# Patient Record
Sex: Female | Born: 1955
Health system: Southern US, Community
[De-identification: ages and names within clinical notes are randomized; demographics above are authoritative.]

---

## 2001-07-28 ENCOUNTER — Encounter: Admission: RE | Admit: 2001-07-28 | Discharge: 2001-07-28 | Payer: Self-pay | Admitting: Family Medicine

## 2001-07-28 ENCOUNTER — Encounter: Payer: Self-pay | Admitting: Family Medicine

## 2003-11-28 ENCOUNTER — Emergency Department (HOSPITAL_COMMUNITY): Admission: EM | Admit: 2003-11-28 | Discharge: 2003-11-28 | Payer: Self-pay | Admitting: Emergency Medicine

## 2004-09-03 ENCOUNTER — Other Ambulatory Visit: Admission: RE | Admit: 2004-09-03 | Discharge: 2004-09-03 | Payer: Self-pay | Admitting: Family Medicine

## 2005-06-20 ENCOUNTER — Encounter: Admission: RE | Admit: 2005-06-20 | Discharge: 2005-06-20 | Payer: Self-pay | Admitting: Orthopedic Surgery

## 2005-06-23 ENCOUNTER — Encounter: Admission: RE | Admit: 2005-06-23 | Discharge: 2005-06-23 | Payer: Self-pay | Admitting: Orthopedic Surgery

## 2009-02-24 ENCOUNTER — Encounter: Admission: RE | Admit: 2009-02-24 | Discharge: 2009-02-24 | Payer: Self-pay | Admitting: Otolaryngology

## 2009-11-01 ENCOUNTER — Other Ambulatory Visit: Admission: RE | Admit: 2009-11-01 | Discharge: 2009-11-01 | Payer: Self-pay | Admitting: Family Medicine

## 2010-01-09 ENCOUNTER — Encounter: Admission: RE | Admit: 2010-01-09 | Discharge: 2010-01-09 | Payer: Self-pay | Admitting: Family Medicine

## 2010-01-13 ENCOUNTER — Ambulatory Visit: Payer: Self-pay | Admitting: Diagnostic Radiology

## 2010-01-13 ENCOUNTER — Emergency Department (HOSPITAL_BASED_OUTPATIENT_CLINIC_OR_DEPARTMENT_OTHER): Admission: EM | Admit: 2010-01-13 | Discharge: 2010-01-13 | Payer: Self-pay | Admitting: Emergency Medicine

## 2010-01-30 ENCOUNTER — Encounter
Admission: RE | Admit: 2010-01-30 | Discharge: 2010-01-30 | Payer: Self-pay | Source: Home / Self Care | Attending: Family Medicine | Admitting: Family Medicine

## 2010-05-01 LAB — COMPREHENSIVE METABOLIC PANEL
ALT: 19 U/L (ref 0–35)
AST: 20 U/L (ref 0–37)
CO2: 28 mEq/L (ref 19–32)
Calcium: 9 mg/dL (ref 8.4–10.5)
GFR calc Af Amer: >60 mL/min (ref >60)
Glucose, Bld: 134 mg/dL — ABNORMAL HIGH (ref 70–99)
Potassium: 3.5 mEq/L (ref 3.5–5.1)
Sodium: 145 mEq/L (ref 135–145)
Total Bilirubin: 0.5 mg/dL (ref 0.3–1.2)

## 2010-05-01 LAB — CBC
HCT: 35.4 % — ABNORMAL LOW (ref 36.0–46.0)
Hemoglobin: 11.8 g/dL — ABNORMAL LOW (ref 12.0–15.0)
MCHC: 33.4 g/dL (ref 30.0–36.0)
MCV: 92.9 fL (ref 78.0–100.0)

## 2010-05-01 LAB — URINALYSIS, ROUTINE W REFLEX MICROSCOPIC
Glucose, UA: NEGATIVE mg/dL
Ketones, ur: 15 mg/dL — AB
Protein, ur: NEGATIVE mg/dL
Specific Gravity, Urine: 1.027 (ref 1.005–1.030)
Urobilinogen, UA: 1 mg/dL (ref 0.0–1.0)

## 2010-05-01 LAB — SEDIMENTATION RATE: Sed Rate: 52 mm/hr — ABNORMAL HIGH (ref 0–22)

## 2010-05-01 LAB — DIFFERENTIAL
Basophils Absolute: 0 10*3/uL (ref 0.0–0.1)
Eosinophils Relative: 1 % (ref 0–5)
Lymphs Abs: 1.5 10*3/uL (ref 0.7–4.0)
Monocytes Absolute: 0.6 10*3/uL (ref 0.1–1.0)
Monocytes Relative: 10 % (ref 3–12)
Neutro Abs: 4 10*3/uL (ref 1.7–7.7)

## 2010-05-01 LAB — URINE MICROSCOPIC-ADD ON

## 2011-11-28 ENCOUNTER — Other Ambulatory Visit: Payer: Self-pay | Admitting: Family Medicine

## 2011-11-28 DIAGNOSIS — Z1231 Encounter for screening mammogram for malignant neoplasm of breast: Secondary | ICD-10-CM

## 2012-01-01 ENCOUNTER — Ambulatory Visit
Admission: RE | Admit: 2012-01-01 | Discharge: 2012-01-01 | Disposition: A | Discharge status: Home / Self Care | Payer: BC Managed Care – PPO | Source: Ambulatory Visit | Attending: Family Medicine | Admitting: Family Medicine

## 2012-01-01 DIAGNOSIS — Z1231 Encounter for screening mammogram for malignant neoplasm of breast: Secondary | ICD-10-CM

## 2012-03-23 IMAGING — CR DG LUMBAR SPINE COMPLETE 4+V
5 series · 5 of 5 positions shown · non-contrast
Comparison: None

CLINICAL DATA: Fall, low back pain.

LUMBAR SPINE - COMPLETE 4+ VIEW

[t l-spine a.p.]
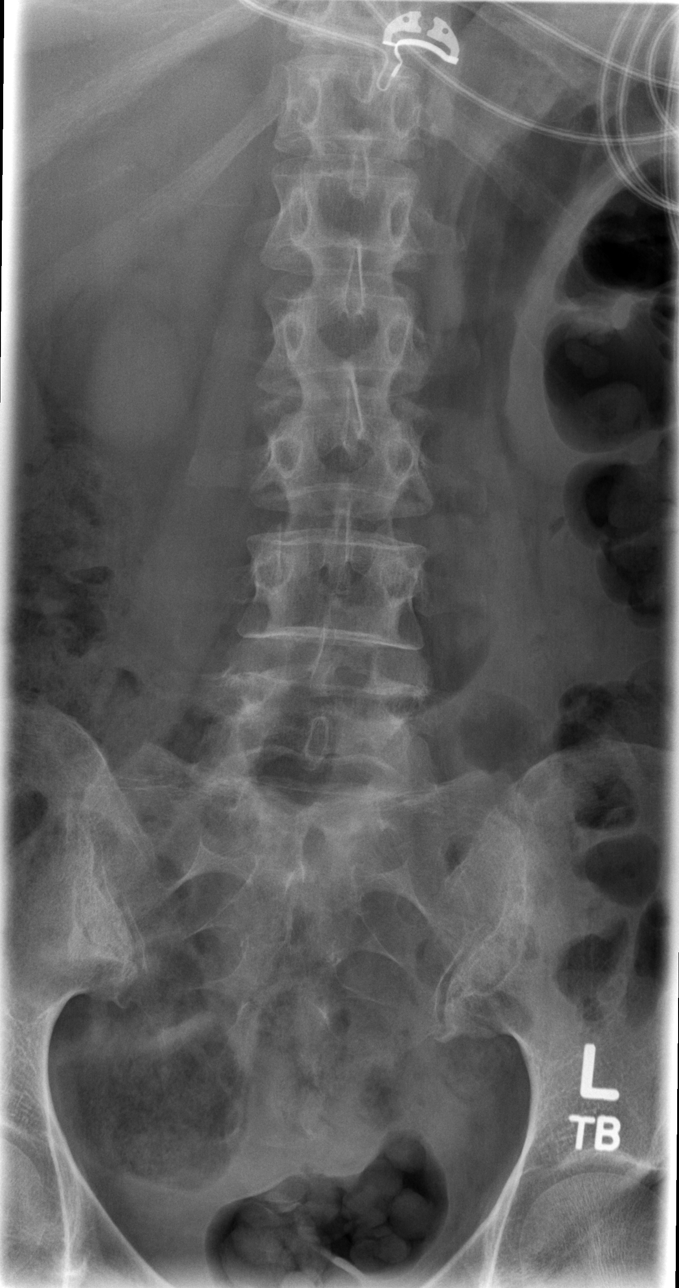

[t l-spine oblique exposure (1 of 2)]
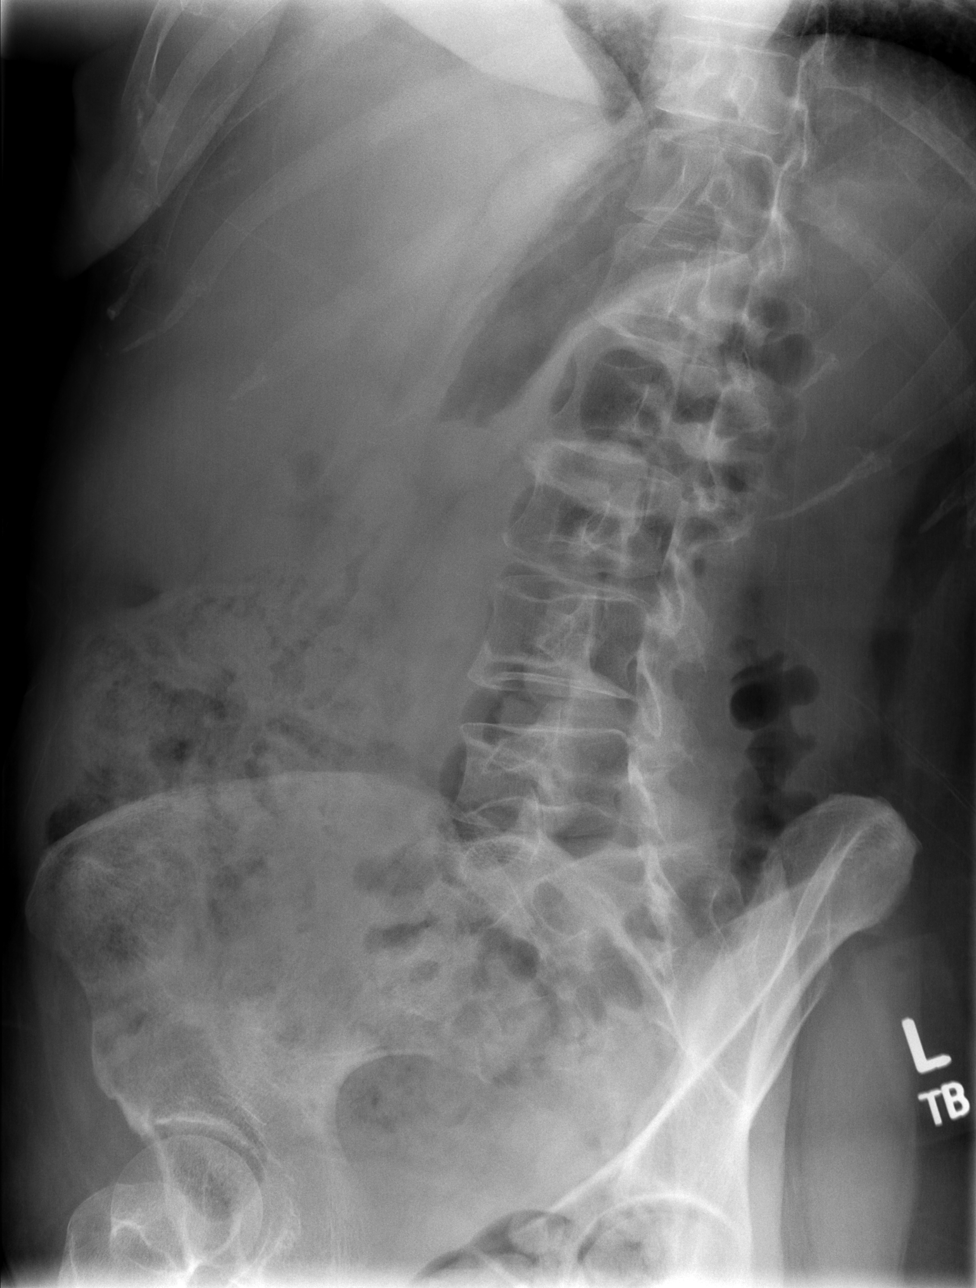

[t l-spine oblique exposure (2 of 2)]
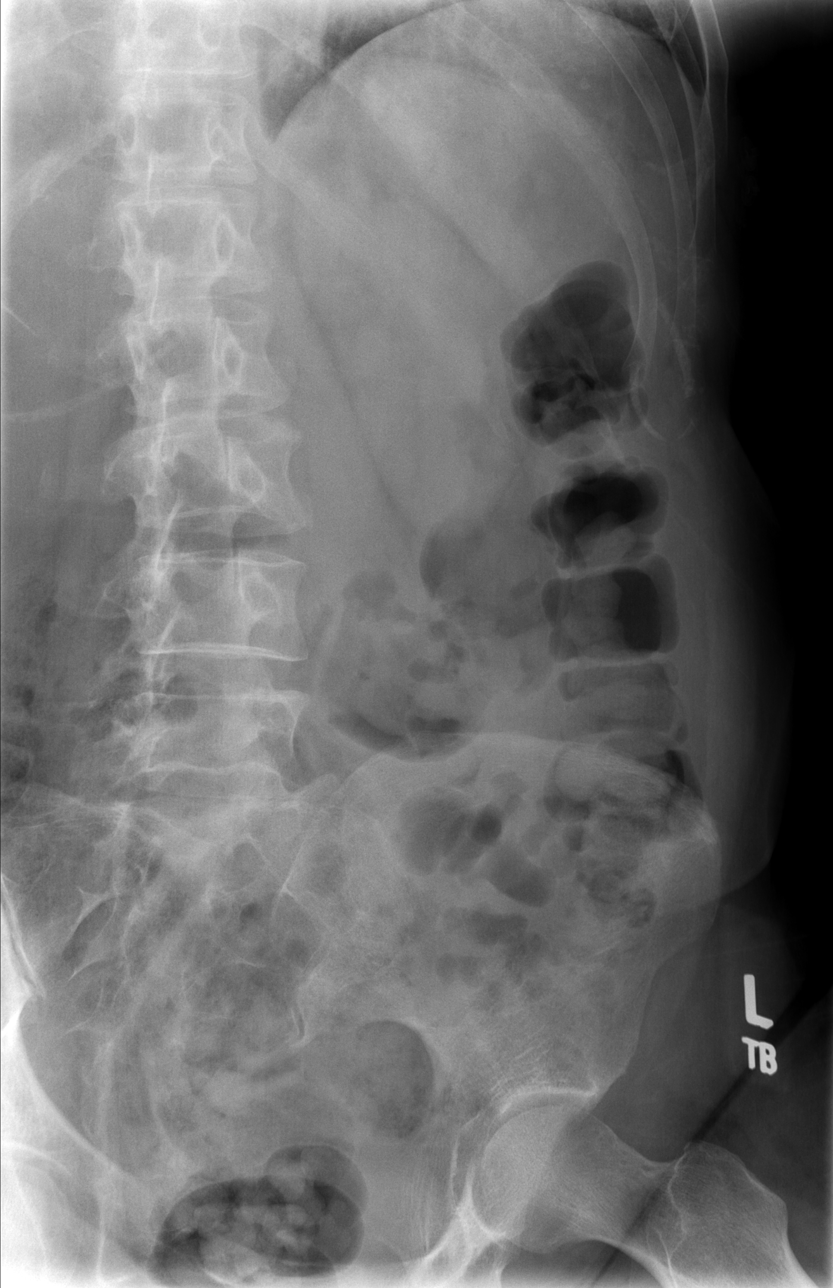

[t l-spine lat]
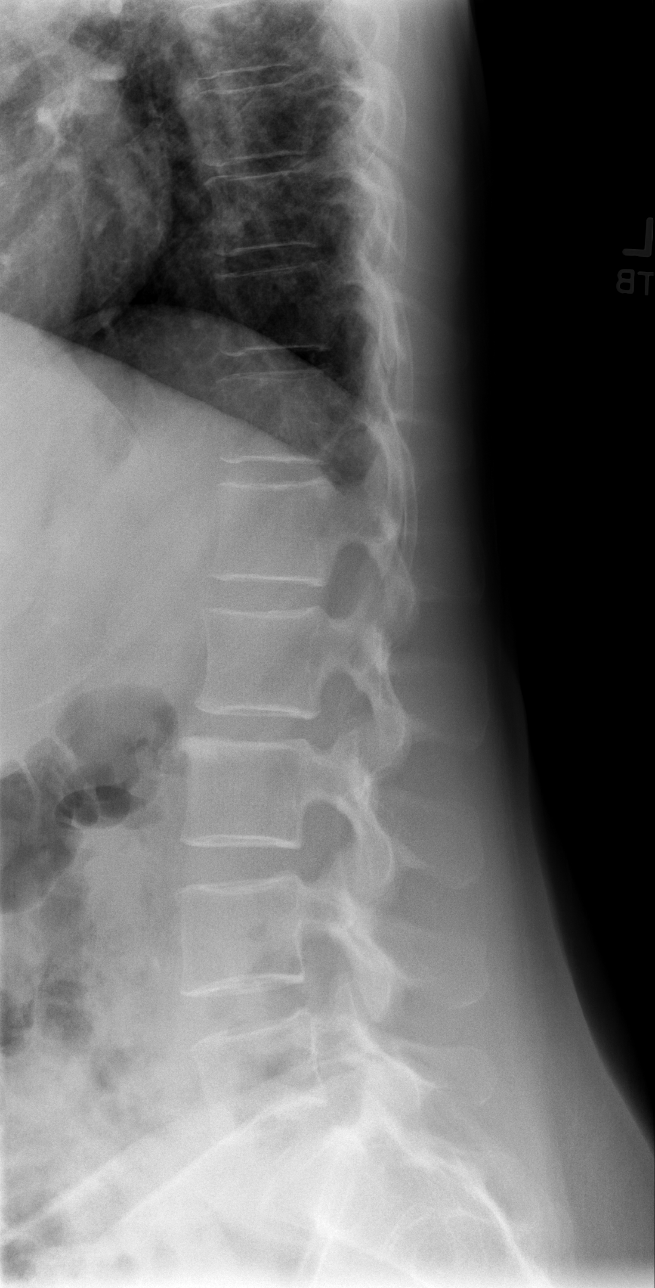

[t l-spine l5-s1 spot]
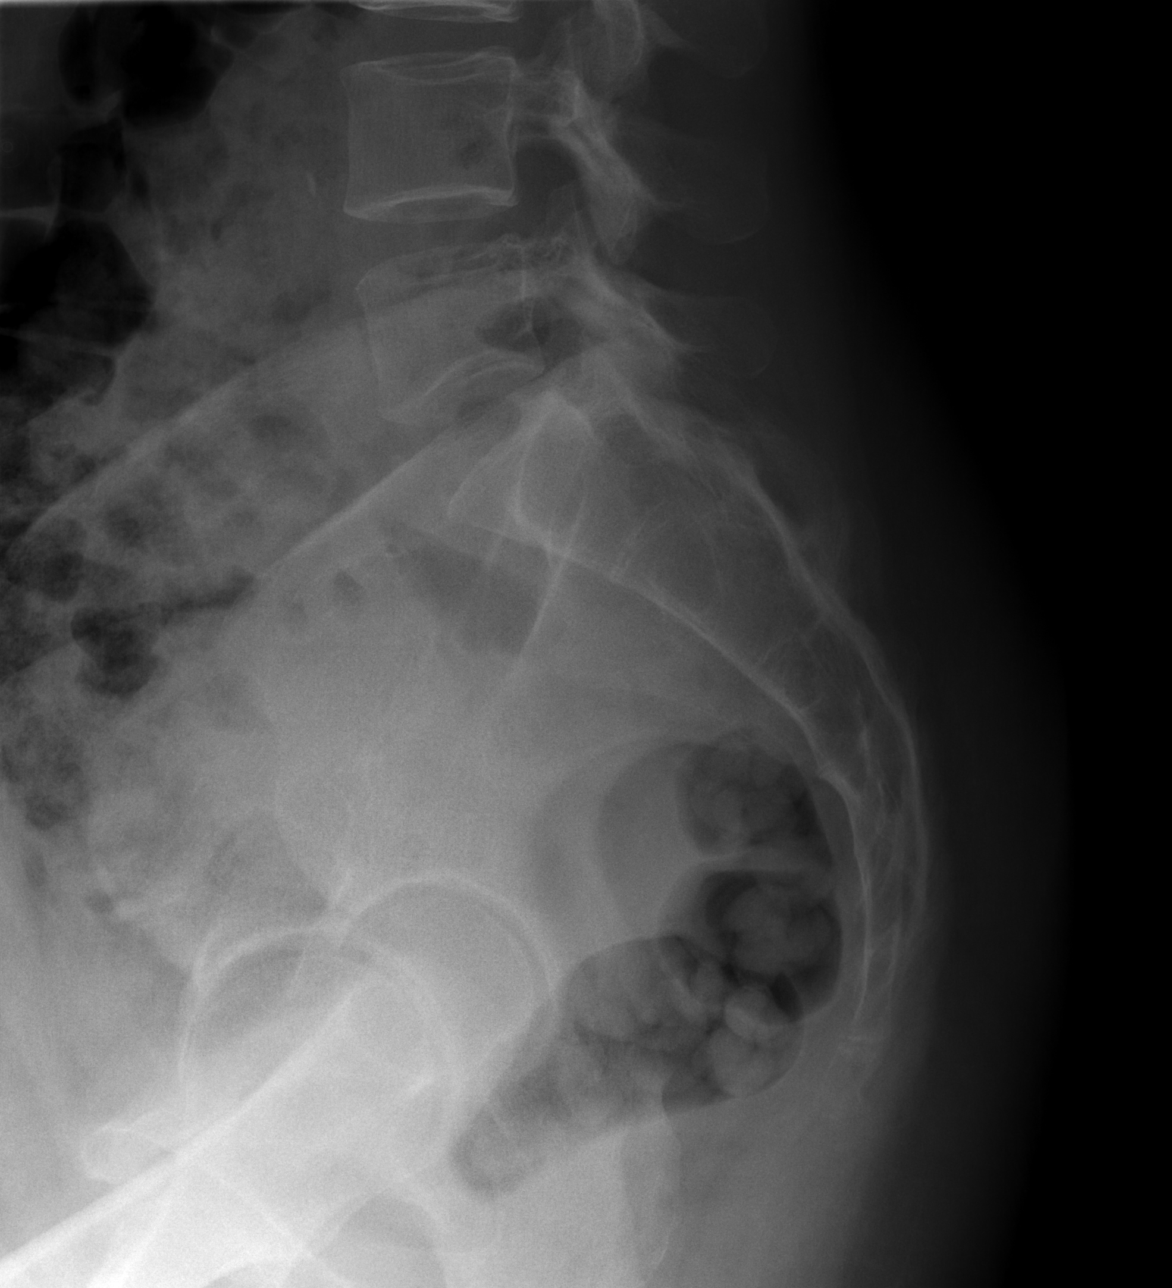

[5 of 5 positions shown; findings below may reference images not displayed]

FINDINGS: There are five lumbar-type vertebral bodies.  No fracture
or malalignment.  Disc spaces well maintained.  SI joints are
symmetric.
IMPRESSION: No acute findings.

## 2015-09-07 DIAGNOSIS — L300 Nummular dermatitis: Secondary | ICD-10-CM | POA: Diagnosis not present

## 2015-10-08 DIAGNOSIS — C44619 Basal cell carcinoma of skin of left upper limb, including shoulder: Secondary | ICD-10-CM | POA: Diagnosis not present

## 2015-10-11 DIAGNOSIS — Z0000 Encounter for general adult medical examination without abnormal findings: Secondary | ICD-10-CM | POA: Diagnosis not present

## 2015-10-11 DIAGNOSIS — M810 Age-related osteoporosis without current pathological fracture: Secondary | ICD-10-CM | POA: Diagnosis not present

## 2015-10-17 DIAGNOSIS — C44619 Basal cell carcinoma of skin of left upper limb, including shoulder: Secondary | ICD-10-CM | POA: Diagnosis not present

## 2015-12-08 DIAGNOSIS — L821 Other seborrheic keratosis: Secondary | ICD-10-CM | POA: Diagnosis not present

## 2015-12-08 DIAGNOSIS — C44619 Basal cell carcinoma of skin of left upper limb, including shoulder: Secondary | ICD-10-CM | POA: Diagnosis not present

## 2015-12-08 DIAGNOSIS — D485 Neoplasm of uncertain behavior of skin: Secondary | ICD-10-CM | POA: Diagnosis not present

## 2015-12-09 DIAGNOSIS — T63444A Toxic effect of venom of bees, undetermined, initial encounter: Secondary | ICD-10-CM | POA: Diagnosis not present

## 2016-01-05 DIAGNOSIS — C44619 Basal cell carcinoma of skin of left upper limb, including shoulder: Secondary | ICD-10-CM | POA: Diagnosis not present

## 2016-01-24 DIAGNOSIS — Z1231 Encounter for screening mammogram for malignant neoplasm of breast: Secondary | ICD-10-CM | POA: Diagnosis not present

## 2016-02-06 DIAGNOSIS — N6320 Unspecified lump in the left breast, unspecified quadrant: Secondary | ICD-10-CM | POA: Diagnosis not present

## 2016-02-07 DIAGNOSIS — H524 Presbyopia: Secondary | ICD-10-CM | POA: Diagnosis not present

## 2016-02-07 DIAGNOSIS — H5203 Hypermetropia, bilateral: Secondary | ICD-10-CM | POA: Diagnosis not present

## 2016-02-23 DIAGNOSIS — C44619 Basal cell carcinoma of skin of left upper limb, including shoulder: Secondary | ICD-10-CM | POA: Diagnosis not present

## 2016-10-18 DIAGNOSIS — Z0000 Encounter for general adult medical examination without abnormal findings: Secondary | ICD-10-CM | POA: Diagnosis not present

## 2016-10-18 DIAGNOSIS — Z13220 Encounter for screening for lipoid disorders: Secondary | ICD-10-CM | POA: Diagnosis not present

## 2016-10-18 DIAGNOSIS — M810 Age-related osteoporosis without current pathological fracture: Secondary | ICD-10-CM | POA: Diagnosis not present

## 2016-10-18 DIAGNOSIS — Z131 Encounter for screening for diabetes mellitus: Secondary | ICD-10-CM | POA: Diagnosis not present

## 2016-12-31 DIAGNOSIS — H5203 Hypermetropia, bilateral: Secondary | ICD-10-CM | POA: Diagnosis not present

## 2017-02-13 DIAGNOSIS — Z1231 Encounter for screening mammogram for malignant neoplasm of breast: Secondary | ICD-10-CM | POA: Diagnosis not present

## 2017-08-04 DIAGNOSIS — T63444A Toxic effect of venom of bees, undetermined, initial encounter: Secondary | ICD-10-CM | POA: Diagnosis not present

## 2017-09-10 DIAGNOSIS — Z131 Encounter for screening for diabetes mellitus: Secondary | ICD-10-CM | POA: Diagnosis not present

## 2017-09-10 DIAGNOSIS — Z0000 Encounter for general adult medical examination without abnormal findings: Secondary | ICD-10-CM | POA: Diagnosis not present

## 2017-09-10 DIAGNOSIS — M810 Age-related osteoporosis without current pathological fracture: Secondary | ICD-10-CM | POA: Diagnosis not present

## 2017-09-10 DIAGNOSIS — Z85828 Personal history of other malignant neoplasm of skin: Secondary | ICD-10-CM | POA: Diagnosis not present

## 2017-09-10 DIAGNOSIS — Z1211 Encounter for screening for malignant neoplasm of colon: Secondary | ICD-10-CM | POA: Diagnosis not present

## 2017-09-10 DIAGNOSIS — Z8249 Family history of ischemic heart disease and other diseases of the circulatory system: Secondary | ICD-10-CM | POA: Diagnosis not present

## 2017-09-10 DIAGNOSIS — Z13220 Encounter for screening for lipoid disorders: Secondary | ICD-10-CM | POA: Diagnosis not present

## 2017-10-15 DIAGNOSIS — R3915 Urgency of urination: Secondary | ICD-10-CM | POA: Diagnosis not present

## 2017-12-17 DIAGNOSIS — D225 Melanocytic nevi of trunk: Secondary | ICD-10-CM | POA: Diagnosis not present

## 2017-12-17 DIAGNOSIS — D1801 Hemangioma of skin and subcutaneous tissue: Secondary | ICD-10-CM | POA: Diagnosis not present

## 2017-12-17 DIAGNOSIS — Z85828 Personal history of other malignant neoplasm of skin: Secondary | ICD-10-CM | POA: Diagnosis not present

## 2018-01-06 DIAGNOSIS — H5203 Hypermetropia, bilateral: Secondary | ICD-10-CM | POA: Diagnosis not present

## 2018-02-17 DIAGNOSIS — Z1231 Encounter for screening mammogram for malignant neoplasm of breast: Secondary | ICD-10-CM | POA: Diagnosis not present

## 2018-03-17 DIAGNOSIS — R739 Hyperglycemia, unspecified: Secondary | ICD-10-CM | POA: Diagnosis not present

## 2018-03-17 DIAGNOSIS — R000 Tachycardia, unspecified: Secondary | ICD-10-CM | POA: Diagnosis not present

## 2018-03-27 ENCOUNTER — Telehealth: Payer: Self-pay

## 2018-03-27 NOTE — Telephone Encounter (Signed)
03-27-18/1:57pm. Left Pt vm asking for a return call [id number] to schedule event monitor./renee v.

## 2018-04-15 ENCOUNTER — Other Ambulatory Visit: Payer: Self-pay | Admitting: Physician Assistant

## 2018-04-15 ENCOUNTER — Ambulatory Visit (INDEPENDENT_AMBULATORY_CARE_PROVIDER_SITE_OTHER): Payer: BLUE CROSS/BLUE SHIELD

## 2018-04-15 DIAGNOSIS — R002 Palpitations: Secondary | ICD-10-CM

## 2018-04-15 DIAGNOSIS — R000 Tachycardia, unspecified: Secondary | ICD-10-CM | POA: Diagnosis not present

## 2018-09-24 DIAGNOSIS — T7840XA Allergy, unspecified, initial encounter: Secondary | ICD-10-CM | POA: Diagnosis not present

## 2018-12-23 DIAGNOSIS — M810 Age-related osteoporosis without current pathological fracture: Secondary | ICD-10-CM | POA: Diagnosis not present

## 2018-12-23 DIAGNOSIS — D649 Anemia, unspecified: Secondary | ICD-10-CM | POA: Diagnosis not present

## 2018-12-23 DIAGNOSIS — Z0000 Encounter for general adult medical examination without abnormal findings: Secondary | ICD-10-CM | POA: Diagnosis not present

## 2018-12-29 DIAGNOSIS — H524 Presbyopia: Secondary | ICD-10-CM | POA: Diagnosis not present

## 2018-12-29 DIAGNOSIS — H5203 Hypermetropia, bilateral: Secondary | ICD-10-CM | POA: Diagnosis not present

## 2018-12-29 DIAGNOSIS — H2513 Age-related nuclear cataract, bilateral: Secondary | ICD-10-CM | POA: Diagnosis not present

## 2018-12-29 DIAGNOSIS — H35361 Drusen (degenerative) of macula, right eye: Secondary | ICD-10-CM | POA: Diagnosis not present

## 2019-05-12 DIAGNOSIS — D485 Neoplasm of uncertain behavior of skin: Secondary | ICD-10-CM | POA: Diagnosis not present

## 2019-05-12 DIAGNOSIS — D225 Melanocytic nevi of trunk: Secondary | ICD-10-CM | POA: Diagnosis not present

## 2019-05-12 DIAGNOSIS — D1801 Hemangioma of skin and subcutaneous tissue: Secondary | ICD-10-CM | POA: Diagnosis not present

## 2019-05-12 DIAGNOSIS — L820 Inflamed seborrheic keratosis: Secondary | ICD-10-CM | POA: Diagnosis not present

## 2019-05-12 DIAGNOSIS — L570 Actinic keratosis: Secondary | ICD-10-CM | POA: Diagnosis not present

## 2019-05-12 DIAGNOSIS — L905 Scar conditions and fibrosis of skin: Secondary | ICD-10-CM | POA: Diagnosis not present

## 2019-05-12 DIAGNOSIS — C44519 Basal cell carcinoma of skin of other part of trunk: Secondary | ICD-10-CM | POA: Diagnosis not present

## 2019-05-12 DIAGNOSIS — L821 Other seborrheic keratosis: Secondary | ICD-10-CM | POA: Diagnosis not present

## 2019-05-24 DIAGNOSIS — T161XXA Foreign body in right ear, initial encounter: Secondary | ICD-10-CM | POA: Diagnosis not present

## 2019-06-04 DIAGNOSIS — Z1231 Encounter for screening mammogram for malignant neoplasm of breast: Secondary | ICD-10-CM | POA: Diagnosis not present

## 2019-06-08 DIAGNOSIS — C44519 Basal cell carcinoma of skin of other part of trunk: Secondary | ICD-10-CM | POA: Diagnosis not present

## 2019-07-21 DIAGNOSIS — Z131 Encounter for screening for diabetes mellitus: Secondary | ICD-10-CM | POA: Diagnosis not present

## 2019-07-21 DIAGNOSIS — Z0000 Encounter for general adult medical examination without abnormal findings: Secondary | ICD-10-CM | POA: Diagnosis not present

## 2019-07-21 DIAGNOSIS — Z13220 Encounter for screening for lipoid disorders: Secondary | ICD-10-CM | POA: Diagnosis not present

## 2019-07-21 DIAGNOSIS — Z23 Encounter for immunization: Secondary | ICD-10-CM | POA: Diagnosis not present

## 2019-10-04 DIAGNOSIS — M67431 Ganglion, right wrist: Secondary | ICD-10-CM | POA: Diagnosis not present

## 2019-10-04 DIAGNOSIS — M65331 Trigger finger, right middle finger: Secondary | ICD-10-CM | POA: Diagnosis not present

## 2019-10-24 DIAGNOSIS — T7840XA Allergy, unspecified, initial encounter: Secondary | ICD-10-CM | POA: Diagnosis not present

## 2019-12-02 DIAGNOSIS — M79644 Pain in right finger(s): Secondary | ICD-10-CM | POA: Diagnosis not present

## 2019-12-02 DIAGNOSIS — M65331 Trigger finger, right middle finger: Secondary | ICD-10-CM | POA: Diagnosis not present

## 2020-01-04 DIAGNOSIS — H2513 Age-related nuclear cataract, bilateral: Secondary | ICD-10-CM | POA: Diagnosis not present

## 2020-01-04 DIAGNOSIS — H353111 Nonexudative age-related macular degeneration, right eye, early dry stage: Secondary | ICD-10-CM | POA: Diagnosis not present

## 2020-03-08 DIAGNOSIS — H9190 Unspecified hearing loss, unspecified ear: Secondary | ICD-10-CM | POA: Diagnosis not present

## 2020-03-08 DIAGNOSIS — T162XXA Foreign body in left ear, initial encounter: Secondary | ICD-10-CM | POA: Diagnosis not present

## 2020-05-12 DIAGNOSIS — H6501 Acute serous otitis media, right ear: Secondary | ICD-10-CM | POA: Diagnosis not present

## 2020-05-12 DIAGNOSIS — H6983 Other specified disorders of Eustachian tube, bilateral: Secondary | ICD-10-CM | POA: Diagnosis not present

## 2020-06-01 DIAGNOSIS — H6983 Other specified disorders of Eustachian tube, bilateral: Secondary | ICD-10-CM | POA: Diagnosis not present

## 2020-06-01 DIAGNOSIS — H6501 Acute serous otitis media, right ear: Secondary | ICD-10-CM | POA: Diagnosis not present

## 2020-06-08 DIAGNOSIS — M65331 Trigger finger, right middle finger: Secondary | ICD-10-CM | POA: Diagnosis not present

## 2020-06-09 DIAGNOSIS — Z1231 Encounter for screening mammogram for malignant neoplasm of breast: Secondary | ICD-10-CM | POA: Diagnosis not present

## 2021-03-08 DIAGNOSIS — Z1211 Encounter for screening for malignant neoplasm of colon: Secondary | ICD-10-CM | POA: Diagnosis not present

## 2021-03-08 DIAGNOSIS — Z0000 Encounter for general adult medical examination without abnormal findings: Secondary | ICD-10-CM | POA: Diagnosis not present

## 2021-03-08 DIAGNOSIS — E785 Hyperlipidemia, unspecified: Secondary | ICD-10-CM | POA: Diagnosis not present

## 2021-03-08 DIAGNOSIS — M810 Age-related osteoporosis without current pathological fracture: Secondary | ICD-10-CM | POA: Diagnosis not present

## 2021-03-08 DIAGNOSIS — M1990 Unspecified osteoarthritis, unspecified site: Secondary | ICD-10-CM | POA: Diagnosis not present

## 2021-06-15 DIAGNOSIS — M85852 Other specified disorders of bone density and structure, left thigh: Secondary | ICD-10-CM | POA: Diagnosis not present

## 2021-06-15 DIAGNOSIS — M810 Age-related osteoporosis without current pathological fracture: Secondary | ICD-10-CM | POA: Diagnosis not present

## 2021-06-15 DIAGNOSIS — Z1231 Encounter for screening mammogram for malignant neoplasm of breast: Secondary | ICD-10-CM | POA: Diagnosis not present

## 2021-06-15 DIAGNOSIS — Z780 Asymptomatic menopausal state: Secondary | ICD-10-CM | POA: Diagnosis not present

## 2021-06-15 DIAGNOSIS — M85851 Other specified disorders of bone density and structure, right thigh: Secondary | ICD-10-CM | POA: Diagnosis not present

## 2021-06-20 DIAGNOSIS — H5203 Hypermetropia, bilateral: Secondary | ICD-10-CM | POA: Diagnosis not present

## 2021-06-20 DIAGNOSIS — H2513 Age-related nuclear cataract, bilateral: Secondary | ICD-10-CM | POA: Diagnosis not present

## 2021-06-20 DIAGNOSIS — H353111 Nonexudative age-related macular degeneration, right eye, early dry stage: Secondary | ICD-10-CM | POA: Diagnosis not present

## 2021-07-09 DIAGNOSIS — T162XXA Foreign body in left ear, initial encounter: Secondary | ICD-10-CM | POA: Diagnosis not present

## 2021-07-09 DIAGNOSIS — H9190 Unspecified hearing loss, unspecified ear: Secondary | ICD-10-CM | POA: Diagnosis not present

## 2021-07-09 DIAGNOSIS — I10 Essential (primary) hypertension: Secondary | ICD-10-CM | POA: Diagnosis not present

## 2021-07-23 DIAGNOSIS — I10 Essential (primary) hypertension: Secondary | ICD-10-CM | POA: Diagnosis not present

## 2021-07-23 DIAGNOSIS — L255 Unspecified contact dermatitis due to plants, except food: Secondary | ICD-10-CM | POA: Diagnosis not present

## 2021-08-13 DIAGNOSIS — I10 Essential (primary) hypertension: Secondary | ICD-10-CM | POA: Diagnosis not present

## 2021-08-13 DIAGNOSIS — T7840XA Allergy, unspecified, initial encounter: Secondary | ICD-10-CM | POA: Diagnosis not present

## 2021-10-10 DIAGNOSIS — Z1211 Encounter for screening for malignant neoplasm of colon: Secondary | ICD-10-CM | POA: Diagnosis not present

## 2021-10-10 DIAGNOSIS — K644 Residual hemorrhoidal skin tags: Secondary | ICD-10-CM | POA: Diagnosis not present

## 2021-10-10 DIAGNOSIS — D123 Benign neoplasm of transverse colon: Secondary | ICD-10-CM | POA: Diagnosis not present

## 2021-10-10 DIAGNOSIS — K648 Other hemorrhoids: Secondary | ICD-10-CM | POA: Diagnosis not present

## 2021-10-10 DIAGNOSIS — D175 Benign lipomatous neoplasm of intra-abdominal organs: Secondary | ICD-10-CM | POA: Diagnosis not present

## 2021-10-10 DIAGNOSIS — K5730 Diverticulosis of large intestine without perforation or abscess without bleeding: Secondary | ICD-10-CM | POA: Diagnosis not present

## 2021-10-15 DIAGNOSIS — D123 Benign neoplasm of transverse colon: Secondary | ICD-10-CM | POA: Diagnosis not present

## 2022-01-04 DIAGNOSIS — I10 Essential (primary) hypertension: Secondary | ICD-10-CM | POA: Diagnosis not present

## 2022-03-13 DIAGNOSIS — I10 Essential (primary) hypertension: Secondary | ICD-10-CM | POA: Diagnosis not present

## 2022-03-13 DIAGNOSIS — Z0000 Encounter for general adult medical examination without abnormal findings: Secondary | ICD-10-CM | POA: Diagnosis not present

## 2022-03-13 DIAGNOSIS — M810 Age-related osteoporosis without current pathological fracture: Secondary | ICD-10-CM | POA: Diagnosis not present

## 2022-03-28 DIAGNOSIS — L814 Other melanin hyperpigmentation: Secondary | ICD-10-CM | POA: Diagnosis not present

## 2022-03-28 DIAGNOSIS — D225 Melanocytic nevi of trunk: Secondary | ICD-10-CM | POA: Diagnosis not present

## 2022-03-28 DIAGNOSIS — L821 Other seborrheic keratosis: Secondary | ICD-10-CM | POA: Diagnosis not present

## 2022-05-02 DIAGNOSIS — M1711 Unilateral primary osteoarthritis, right knee: Secondary | ICD-10-CM | POA: Diagnosis not present

## 2022-05-02 DIAGNOSIS — M170 Bilateral primary osteoarthritis of knee: Secondary | ICD-10-CM | POA: Diagnosis not present

## 2022-05-02 DIAGNOSIS — M25572 Pain in left ankle and joints of left foot: Secondary | ICD-10-CM | POA: Diagnosis not present

## 2022-05-02 DIAGNOSIS — M1712 Unilateral primary osteoarthritis, left knee: Secondary | ICD-10-CM | POA: Diagnosis not present

## 2022-05-02 DIAGNOSIS — M25571 Pain in right ankle and joints of right foot: Secondary | ICD-10-CM | POA: Diagnosis not present

## 2022-06-11 DIAGNOSIS — H6992 Unspecified Eustachian tube disorder, left ear: Secondary | ICD-10-CM | POA: Diagnosis not present

## 2022-06-11 DIAGNOSIS — H9202 Otalgia, left ear: Secondary | ICD-10-CM | POA: Diagnosis not present

## 2022-06-11 DIAGNOSIS — H6522 Chronic serous otitis media, left ear: Secondary | ICD-10-CM | POA: Diagnosis not present

## 2022-06-12 DIAGNOSIS — H90A32 Mixed conductive and sensorineural hearing loss, unilateral, left ear with restricted hearing on the contralateral side: Secondary | ICD-10-CM | POA: Diagnosis not present

## 2022-06-12 DIAGNOSIS — H90A21 Sensorineural hearing loss, unilateral, right ear, with restricted hearing on the contralateral side: Secondary | ICD-10-CM | POA: Diagnosis not present

## 2022-06-24 DIAGNOSIS — M1711 Unilateral primary osteoarthritis, right knee: Secondary | ICD-10-CM | POA: Diagnosis not present

## 2022-06-24 DIAGNOSIS — M25561 Pain in right knee: Secondary | ICD-10-CM | POA: Diagnosis not present

## 2022-07-01 DIAGNOSIS — Z1231 Encounter for screening mammogram for malignant neoplasm of breast: Secondary | ICD-10-CM | POA: Diagnosis not present

## 2022-07-04 DIAGNOSIS — M25561 Pain in right knee: Secondary | ICD-10-CM | POA: Diagnosis not present

## 2022-07-04 DIAGNOSIS — H9012 Conductive hearing loss, unilateral, left ear, with unrestricted hearing on the contralateral side: Secondary | ICD-10-CM | POA: Diagnosis not present

## 2022-07-04 DIAGNOSIS — H6522 Chronic serous otitis media, left ear: Secondary | ICD-10-CM | POA: Diagnosis not present

## 2022-07-11 DIAGNOSIS — H52203 Unspecified astigmatism, bilateral: Secondary | ICD-10-CM | POA: Diagnosis not present

## 2022-07-11 DIAGNOSIS — M25561 Pain in right knee: Secondary | ICD-10-CM | POA: Diagnosis not present

## 2022-07-31 DIAGNOSIS — M94261 Chondromalacia, right knee: Secondary | ICD-10-CM | POA: Diagnosis not present

## 2022-07-31 DIAGNOSIS — Y999 Unspecified external cause status: Secondary | ICD-10-CM | POA: Diagnosis not present

## 2022-07-31 DIAGNOSIS — X58XXXA Exposure to other specified factors, initial encounter: Secondary | ICD-10-CM | POA: Diagnosis not present

## 2022-07-31 DIAGNOSIS — S83271A Complex tear of lateral meniscus, current injury, right knee, initial encounter: Secondary | ICD-10-CM | POA: Diagnosis not present

## 2022-07-31 DIAGNOSIS — G8918 Other acute postprocedural pain: Secondary | ICD-10-CM | POA: Diagnosis not present

## 2022-07-31 DIAGNOSIS — S83231A Complex tear of medial meniscus, current injury, right knee, initial encounter: Secondary | ICD-10-CM | POA: Diagnosis not present

## 2022-07-31 DIAGNOSIS — M6751 Plica syndrome, right knee: Secondary | ICD-10-CM | POA: Diagnosis not present

## 2022-08-07 DIAGNOSIS — R531 Weakness: Secondary | ICD-10-CM | POA: Diagnosis not present

## 2022-08-07 DIAGNOSIS — M25561 Pain in right knee: Secondary | ICD-10-CM | POA: Diagnosis not present

## 2022-08-12 DIAGNOSIS — H2512 Age-related nuclear cataract, left eye: Secondary | ICD-10-CM | POA: Diagnosis not present

## 2022-08-12 DIAGNOSIS — H25812 Combined forms of age-related cataract, left eye: Secondary | ICD-10-CM | POA: Diagnosis not present

## 2022-08-26 DIAGNOSIS — H25811 Combined forms of age-related cataract, right eye: Secondary | ICD-10-CM | POA: Diagnosis not present

## 2022-08-26 DIAGNOSIS — H2511 Age-related nuclear cataract, right eye: Secondary | ICD-10-CM | POA: Diagnosis not present

## 2022-09-02 DIAGNOSIS — L821 Other seborrheic keratosis: Secondary | ICD-10-CM | POA: Diagnosis not present

## 2023-02-10 DIAGNOSIS — H9012 Conductive hearing loss, unilateral, left ear, with unrestricted hearing on the contralateral side: Secondary | ICD-10-CM | POA: Diagnosis not present

## 2023-02-10 DIAGNOSIS — H903 Sensorineural hearing loss, bilateral: Secondary | ICD-10-CM | POA: Diagnosis not present

## 2023-03-12 DIAGNOSIS — I10 Essential (primary) hypertension: Secondary | ICD-10-CM | POA: Diagnosis not present

## 2023-03-12 DIAGNOSIS — Z0000 Encounter for general adult medical examination without abnormal findings: Secondary | ICD-10-CM | POA: Diagnosis not present

## 2023-03-12 DIAGNOSIS — M810 Age-related osteoporosis without current pathological fracture: Secondary | ICD-10-CM | POA: Diagnosis not present

## 2023-05-23 DIAGNOSIS — L814 Other melanin hyperpigmentation: Secondary | ICD-10-CM | POA: Diagnosis not present

## 2023-05-23 DIAGNOSIS — D225 Melanocytic nevi of trunk: Secondary | ICD-10-CM | POA: Diagnosis not present

## 2023-05-23 DIAGNOSIS — L821 Other seborrheic keratosis: Secondary | ICD-10-CM | POA: Diagnosis not present

## 2023-07-02 DIAGNOSIS — Z1231 Encounter for screening mammogram for malignant neoplasm of breast: Secondary | ICD-10-CM | POA: Diagnosis not present

## 2023-07-02 DIAGNOSIS — M8588 Other specified disorders of bone density and structure, other site: Secondary | ICD-10-CM | POA: Diagnosis not present

## 2023-07-02 DIAGNOSIS — Z8262 Family history of osteoporosis: Secondary | ICD-10-CM | POA: Diagnosis not present

## 2023-07-28 DIAGNOSIS — L200 Besnier's prurigo: Secondary | ICD-10-CM | POA: Diagnosis not present

## 2023-09-25 DIAGNOSIS — H524 Presbyopia: Secondary | ICD-10-CM | POA: Diagnosis not present

## 2024-01-14 DIAGNOSIS — Z01818 Encounter for other preprocedural examination: Secondary | ICD-10-CM | POA: Diagnosis not present

## 2024-01-14 DIAGNOSIS — J309 Allergic rhinitis, unspecified: Secondary | ICD-10-CM | POA: Diagnosis not present

## 2024-01-14 DIAGNOSIS — M810 Age-related osteoporosis without current pathological fracture: Secondary | ICD-10-CM | POA: Diagnosis not present

## 2024-01-14 DIAGNOSIS — I10 Essential (primary) hypertension: Secondary | ICD-10-CM | POA: Diagnosis not present

## 2024-01-14 DIAGNOSIS — L821 Other seborrheic keratosis: Secondary | ICD-10-CM | POA: Diagnosis not present
